# Patient Record
Sex: Female | Born: 1990 | Race: Black or African American | Hispanic: No | Marital: Single | Smoking: Never smoker
Health system: Southern US, Community
[De-identification: ages and names within clinical notes are randomized; demographics above are authoritative.]

---

## 2009-05-30 ENCOUNTER — Emergency Department (HOSPITAL_COMMUNITY): Admission: EM | Admit: 2009-05-30 | Discharge: 2009-05-30 | Payer: Self-pay | Admitting: Emergency Medicine

## 2014-06-22 ENCOUNTER — Emergency Department: Payer: Self-pay | Admitting: Emergency Medicine

## 2014-09-02 ENCOUNTER — Emergency Department
Admission: EM | Admit: 2014-09-02 | Discharge: 2014-09-02 | Disposition: A | Discharge status: Home / Self Care | Payer: No Typology Code available for payment source | Attending: Emergency Medicine | Admitting: Emergency Medicine

## 2014-09-02 ENCOUNTER — Encounter: Payer: Self-pay | Admitting: *Deleted

## 2014-09-02 DIAGNOSIS — S46911A Strain of unspecified muscle, fascia and tendon at shoulder and upper arm level, right arm, initial encounter: Secondary | ICD-10-CM | POA: Diagnosis not present

## 2014-09-02 DIAGNOSIS — S4991XA Unspecified injury of right shoulder and upper arm, initial encounter: Secondary | ICD-10-CM | POA: Diagnosis present

## 2014-09-02 DIAGNOSIS — S66911A Strain of unspecified muscle, fascia and tendon at wrist and hand level, right hand, initial encounter: Secondary | ICD-10-CM | POA: Diagnosis not present

## 2014-09-02 DIAGNOSIS — Y9389 Activity, other specified: Secondary | ICD-10-CM | POA: Diagnosis not present

## 2014-09-02 DIAGNOSIS — Y998 Other external cause status: Secondary | ICD-10-CM | POA: Diagnosis not present

## 2014-09-02 DIAGNOSIS — Y92410 Unspecified street and highway as the place of occurrence of the external cause: Secondary | ICD-10-CM | POA: Insufficient documentation

## 2014-09-02 MED ORDER — CYCLOBENZAPRINE HCL 10 MG PO TABS: 10.0000 mg | ORAL_TABLET | Freq: Three times a day (TID) | ORAL | Status: AC | PRN

## 2014-09-02 MED ORDER — NAPROXEN 500 MG PO TABS: 500.0000 mg | ORAL_TABLET | Freq: Two times a day (BID) | ORAL | Status: AC
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
## 2017-07-27 ENCOUNTER — Encounter: Payer: Self-pay | Admitting: Emergency Medicine

## 2017-07-27 ENCOUNTER — Other Ambulatory Visit: Payer: Self-pay

## 2017-07-27 ENCOUNTER — Emergency Department
Admission: EM | Admit: 2017-07-27 | Discharge: 2017-07-27 | Disposition: A | Discharge status: Home / Self Care | Payer: 59 | Attending: Emergency Medicine | Admitting: Emergency Medicine

## 2017-07-27 DIAGNOSIS — T7621XA Adult sexual abuse, suspected, initial encounter: Secondary | ICD-10-CM | POA: Diagnosis present

## 2017-07-27 DIAGNOSIS — T7421XA Adult sexual abuse, confirmed, initial encounter: Secondary | ICD-10-CM | POA: Diagnosis not present

## 2017-07-27 LAB — URINALYSIS, COMPLETE (UACMP) WITH MICROSCOPIC
Bacteria, UA: NONE SEEN
Bilirubin Urine: NEGATIVE
GLUCOSE, UA: NEGATIVE mg/dL
HGB URINE DIPSTICK: NEGATIVE
Ketones, ur: NEGATIVE mg/dL
NITRITE: POSITIVE — AB
PH: 7 (ref 5.0–8.0)
PROTEIN: 30 mg/dL — AB
SPECIFIC GRAVITY, URINE: 1.021 (ref 1.005–1.030)

## 2017-07-27 LAB — PREGNANCY, URINE: Preg Test, Ur: NEGATIVE

## 2017-07-27 LAB — RAPID HIV SCREEN (HIV 1/2 AB+AG)
HIV 1/2 Antibodies: NONREACTIVE
HIV-1 P24 Antigen - HIV24: NONREACTIVE

## 2017-07-27 MED ORDER — ULIPRISTAL ACETATE 30 MG PO TABS
30.0000 mg | ORAL_TABLET | Freq: Once | ORAL | Status: AC
Start: 2017-07-27 — End: 2017-07-27
  Administered 2017-07-27: 30 mg via ORAL
  Filled 2017-07-27: qty 1

## 2017-07-27 MED ORDER — LIDOCAINE HCL (PF) 1 % IJ SOLN
0.9000 mL | Freq: Once | INTRAMUSCULAR | Status: AC
  Administered 2017-07-27: 0.9 mL
  Filled 2017-07-27: qty 5

## 2017-07-27 MED ORDER — AZITHROMYCIN 500 MG PO TABS
1000.0000 mg | ORAL_TABLET | Freq: Once | ORAL | Status: AC
  Administered 2017-07-27: 1000 mg via ORAL
  Filled 2017-07-27: qty 2

## 2017-07-27 MED ORDER — METRONIDAZOLE 500 MG PO TABS
2000.0000 mg | ORAL_TABLET | Freq: Once | ORAL | Status: AC
  Administered 2017-07-27: 2000 mg via ORAL
  Filled 2017-07-27: qty 4

## 2017-07-27 MED ORDER — CEFTRIAXONE SODIUM 250 MG IJ SOLR
250.0000 mg | Freq: Once | INTRAMUSCULAR | Status: AC
  Administered 2017-07-27: 250 mg via INTRAMUSCULAR
  Filled 2017-07-27: qty 250
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
## 2017-07-27 NOTE — ED Notes (Signed)
Blood drawn and pt instructed to blot after urination and to keep toilet paper, per SANE nurse instructions to this RN.
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
## 2017-07-27 NOTE — SANE Note (Signed)
HIV rapid test = negative for antibodies.    Pt declined HIV prophylaxis.  Flagyl given to pt to take at home on Wednesday with food.  Verbalizes an understanding of discharge instructions.

## 2017-07-27 NOTE — ED Notes (Signed)
SANE nurse arrived at this time.

## 2017-11-01 ENCOUNTER — Ambulatory Visit (INDEPENDENT_AMBULATORY_CARE_PROVIDER_SITE_OTHER): Payer: 59

## 2017-11-01 ENCOUNTER — Ambulatory Visit
Admission: EM | Admit: 2017-11-01 | Discharge: 2017-11-01 | Disposition: A | Discharge status: Home / Self Care | Payer: 59 | Attending: Family Medicine | Admitting: Family Medicine

## 2017-11-01 DIAGNOSIS — M25561 Pain in right knee: Secondary | ICD-10-CM

## 2017-11-01 MED ORDER — MELOXICAM 15 MG PO TABS: 15.0000 mg | ORAL_TABLET | Freq: Every day | ORAL | 0 refills | Status: DC | PRN

## 2017-11-01 NOTE — ED Triage Notes (Signed)
As per patient has Right knee pain onset months but past few weeks getting worst started mid December 2018 in [employer], any running, jumping activity pops her knee.

## 2017-11-01 NOTE — ED Provider Notes (Signed)
[facility] ____________________________________________  Time seen: Approximately 2:09 PM  I have reviewed the triage vital signs and the nursing notes.   HISTORY  Chief Complaint Knee Pain   HPI Vanessa Douglas is a 27 y.o. female presenting for evaluation of right knee pain.  Patient states that she has been having intermittent right pain for the last 7 months, increase of the last few weeks.  States that pain started when she started exercising again stating pain is present with most activities, but particularly worse with squatting and running.  Has continue to remain active.  Denies pain radiation or paresthesias.  States that pain is accompanied with a popping sensation as well as occasional feeling of giving out.  Denies direct trauma or direct injury.  Denies fall.  No rash, skin changes or noticeable swelling.  Reports otherwise feels well.  States when the pain first started in December she was having significant pain, but reports now is more of a milder pain.  Occasionally takes ibuprofen.  Does not take any medications on a regular basis.  Denies other aggravating or alleviating factors.  Reports otherwise feels well.Denies recent sickness. Denies recent antibiotic use.   No LMP recorded. (Menstrual status: IUD).Denies pregnancy.   History reviewed. No pertinent past medical history.  There are no active problems to display for this patient.   History reviewed. No pertinent surgical history.   No current facility-administered medications for this encounter.   Current Outpatient Medications:  .  meloxicam (MOBIC) 15 MG tablet, Take 1 tablet (15 mg total) by mouth daily as needed., Disp: 10 tablet, Rfl: 0  Allergies Septra [sulfamethoxazole-trimethoprim]  No family history on file.  Social History Social History   Tobacco Use  . Smoking status: Never Smoker  . Smokeless tobacco: Never Used  Substance Use Topics  . Alcohol use: Yes  . Drug use: No      Review of Systems Constitutional: No fever/chills Cardiovascular: Denies chest pain. Respiratory: Denies shortness of breath. Gastrointestinal: No abdominal pain.   Musculoskeletal: Negative for back pain. As above.  Skin: Negative for rash.  ____________________________________________   PHYSICAL EXAM:  VITAL SIGNS: ED Triage Vitals  Enc Vitals Group     BP 11/01/17 1142 108/79     Pulse Rate 11/01/17 1142 66     Resp 11/01/17 1142 16     Temp 11/01/17 1142 99.1 F (37.3 C)     Temp Source 11/01/17 1142 Oral     SpO2 11/01/17 1142 99 %     Weight 11/01/17 1140 203 lb (92.1 kg)     Height 11/01/17 1140 5' 9.5" (1.765 m)     Head Circumference --      Peak Flow --      Pain Score 11/01/17 1139 7     Pain Loc --      Pain Edu? --      Excl. in GC? --     Constitutional: Alert and oriented. Well appearing and in no acute distress. ENT      Head: Normocephalic and atraumatic. Cardiovascular: Normal rate, regular rhythm. Grossly normal heart sounds.  Good peripheral circulation. Respiratory: Normal respiratory effort without tachypnea nor retractions. Breath sounds are clear and equal bilaterally. No wheezes, rales, rhonchi. Musculoskeletal:  Steady gait. Bilateral pedal pulses equal and easily palpated. Except: Right anterior to lateral knee mild tenderness to direct palpation surrounding patella and more laterally, mild lateral joint effusion noted, mild pain with resisted knee extension, no pain with flexion, mild  pain with lateral stress, no pain with medial stress, mild pain with anterior drawer test, negative posterior drawer test, full range of motion present.  Right lower chimney otherwise nontender. Neurologic:  Normal speech and language. No gross focal neurologic deficits are appreciated. Speech is normal. No gait instability.  Skin:  Skin is warm, dry and intact. No rash noted. Psychiatric: Mood and affect are normal. Speech and behavior are normal. Patient  exhibits appropriate insight and judgment   ___________________________________________   LABS (all labs ordered are listed, but only abnormal results are displayed)  Labs Reviewed - No data to display  RADIOLOGY  Dg Knee Complete 4 Views Right  Result Date: 11/01/2017 CLINICAL DATA:  Anterolateral right knee pain for 6 months. No reported injury. EXAM: RIGHT KNEE - COMPLETE 4+ VIEW COMPARISON:  None. FINDINGS: No fracture. No dislocation. Small suprapatellar right knee joint effusion. Minimal tricompartmental right knee osteoarthritis with tiny marginal osteophytes in all compartments. Small right tibial tubercle enthesophyte. No suspicious focal osseous lesions. No radiopaque foreign body. IMPRESSION: Small suprapatellar right knee joint effusion. Minimal tricompartmental right knee osteoarthritis. Small right tibial tubercle enthesophyte. Electronically Signed   By: Delbert Phenix M.D.   On: 11/01/2017 12:54   ____________________________________________   PROCEDURES Procedures   INITIAL IMPRESSION / ASSESSMENT AND PLAN / ED COURSE  Pertinent labs & imaging results that were available during my care of the patient were reviewed by me and considered in my medical decision making (see chart for details).  Well-appearing patient.  No acute distress.  Right knee x-ray as above per radiologist, small suprapatellar right knee joint effusion with right knee osteoarthritis.  Discussed this in detail with patient.  Also discussed concern and suspect of meniscal injury.  Recommend for further follow-up with orthopedic as may need MRI.  Will treat with oral daily Mobic and recommended knee sleeve with patellar opening, and avoidance of aggravating activities. Discussed indication, risks and benefits of medications with patient.  Discussed follow up with Primary care physician this week. Discussed follow up and return parameters including no resolution or any worsening concerns. Patient verbalized  understanding and agreed to plan.   ____________________________________________   FINAL CLINICAL IMPRESSION(S) / ED DIAGNOSES  Final diagnoses:  Acute pain of right knee     ED Discharge Orders        Ordered    meloxicam (MOBIC) 15 MG tablet  Daily PRN     11/01/17 1258       Note: This dictation was prepared with Dragon dictation along with smaller phrase technology. Any transcriptional errors that result from this process are unintentional.         Renford Dills, NP 11/01/17 1414

## 2017-11-01 NOTE — Discharge Instructions (Addendum)
Take medication as prescribed. Rest. Avoid aggravating activities. Wear knee brace as discussed.   Follow-up with orthopedic in 1 week as discussed.  See above.  Follow up with your primary care physician this week as needed. Return to Urgent care for new or worsening concerns.

## 2018-04-13 ENCOUNTER — Other Ambulatory Visit: Payer: Self-pay

## 2018-04-13 ENCOUNTER — Emergency Department
Admission: EM | Admit: 2018-04-13 | Discharge: 2018-04-14 | Disposition: A | Discharge status: Home / Self Care | Payer: 59 | Attending: Emergency Medicine | Admitting: Emergency Medicine

## 2018-04-13 ENCOUNTER — Emergency Department: Payer: 59

## 2018-04-13 ENCOUNTER — Encounter: Payer: Self-pay | Admitting: Emergency Medicine

## 2018-04-13 DIAGNOSIS — F419 Anxiety disorder, unspecified: Secondary | ICD-10-CM | POA: Diagnosis not present

## 2018-04-13 DIAGNOSIS — R0602 Shortness of breath: Secondary | ICD-10-CM | POA: Insufficient documentation

## 2018-04-13 DIAGNOSIS — R251 Tremor, unspecified: Secondary | ICD-10-CM | POA: Diagnosis present

## 2018-04-13 NOTE — ED Triage Notes (Signed)
Pt in via [facility] from home; reports sudden onset shortness of breath and tremors while at home watching tv, denies any chest pain.  Pt vitals WDL, symptoms have resolved at this time.  Pt denies any hx of anxiety or panic attacks.  NAD noted at this time.

## 2018-04-14 LAB — CBC
HEMATOCRIT: 39.4 % (ref 36.0–46.0)
HEMOGLOBIN: 12.9 g/dL (ref 12.0–15.0)
MCH: 28 pg (ref 26.0–34.0)
MCHC: 32.7 g/dL (ref 30.0–36.0)
MCV: 85.7 fL (ref 80.0–100.0)
Platelets: 315 10*3/uL (ref 150–400)
RBC: 4.6 MIL/uL (ref 3.87–5.11)
RDW: 12.8 % (ref 11.5–15.5)
WBC: 9.8 10*3/uL (ref 4.0–10.5)
nRBC: 0 % (ref 0.0–0.2)

## 2018-04-14 LAB — BASIC METABOLIC PANEL
ANION GAP: 7 (ref 5–15)
BUN: 18 mg/dL (ref 6–20)
CO2: 26 mmol/L (ref 22–32)
Calcium: 9.4 mg/dL (ref 8.9–10.3)
Chloride: 104 mmol/L (ref 98–111)
Creatinine, Ser: 0.96 mg/dL (ref 0.44–1.00)
GFR calc Af Amer: >60 mL/min (ref >60)
GFR calc non Af Amer: >60 mL/min (ref >60)
GLUCOSE: 102 mg/dL — AB (ref 70–99)
Potassium: 3.8 mmol/L (ref 3.5–5.1)
Sodium: 137 mmol/L (ref 135–145)

## 2018-04-14 LAB — FIBRIN DERIVATIVES D-DIMER (ARMC ONLY): Fibrin derivatives D-dimer (ARMC): 230.54 ng/mL (FEU) (ref 0.00–499.00)

## 2018-04-14 LAB — TSH: TSH: 1.622 u[IU]/mL (ref 0.350–4.500)

## 2018-04-14 LAB — TROPONIN I: Troponin I: <0.03 ng/mL (ref <0.03)

## 2018-04-14 NOTE — ED Provider Notes (Signed)
[facility] Provider Note __   First MD Initiated Contact with Patient 04/13/18 2356     (approximate)  I have reviewed the triage vital signs and the nursing notes.   HISTORY  Chief Complaint Panic Attack   HPI Vanessa Douglas is a 27 y.o. female with no chronic medical conditions presents emergency department via EMS from home with report of sudden onset of bilateral arm tremulousness while watching television tonight.  Patient states that subsequently upon standing she experience shortness of breath and chest tightness.  Patient states that symptoms resolved without any intervention after 30 minutes.  Patient does not admit to any preceding feelings of anxiety.  Patient denies any known history of CAD DVT or PE.   Patient denies any lower extremity pain or swelling.  Patient denies any nausea vomiting diaphoresis or dizziness.   Past medical history None There are no active problems to display for this patient.   Past surgical history None  Prior to Admission medications   Medication Sig Start Date End Date Taking? Authorizing Provider  meloxicam (MOBIC) 15 MG tablet Take 1 tablet (15 mg total) by mouth daily as needed. 11/01/17   Renford Dills, NP    Allergies Septra [sulfamethoxazole-trimethoprim]  No family history on file.  Social History Social History   Tobacco Use  . Smoking status: Never Smoker  . Smokeless tobacco: Never Used  Substance Use Topics  . Alcohol use: Yes  . Drug use: No    Review of Systems Constitutional: No fever/chills Eyes: No visual changes. ENT: No sore throat. Cardiovascular: Denies chest pain. Respiratory: Positive for shortness of breath (resolved) Gastrointestinal: No abdominal pain.  No nausea, no vomiting.  No diarrhea.  No constipation. Genitourinary: Negative for dysuria. Musculoskeletal: Negative for neck pain.  Negative for back pain. Integumentary: Negative for  rash. Neurological: Negative for headaches, focal weakness or numbness.   ____________________________________________   PHYSICAL EXAM:  VITAL SIGNS: ED Triage Vitals  Enc Vitals Group     BP 04/13/18 2225 131/79     Pulse Rate 04/13/18 2225 86     Resp 04/13/18 2225 16     Temp 04/13/18 2225 98.4 F (36.9 C)     Temp Source 04/13/18 2225 Oral     SpO2 04/13/18 2225 96 %     Weight 04/13/18 2226 96.2 kg (212 lb)     Height 04/13/18 2226 1.753 m (5\' 9" )     Head Circumference --      Peak Flow --      Pain Score 04/13/18 2226 0     Pain Loc --      Pain Edu? --      Excl. in GC? --     Constitutional: Alert and oriented. Well appearing and in no acute distress. Eyes: Conjunctivae are normal. PERRL. EOMI. Mouth/Throat: Mucous membranes are moist.  Oropharynx non-erythematous. Neck: No stridor.   Cardiovascular: Normal rate, regular rhythm. Good peripheral circulation. Grossly normal heart sounds. Respiratory: Normal respiratory effort.  No retractions. Lungs CTAB. Gastrointestinal: Soft and nontender. No distention.  Musculoskeletal: No lower extremity tenderness nor edema. No gross deformities of extremities. Neurologic:  Normal speech and language. No gross focal neurologic deficits are appreciated.  Skin:  Skin is warm, dry and intact. No rash noted. Psychiatric: Mood and affect are normal. Speech and behavior are normal.  ____________________________________________   LABS (all labs ordered are listed, but only abnormal results are displayed)  Labs Reviewed  BASIC METABOLIC  PANEL - Abnormal; Notable for the following components:      Result Value   Glucose, Bld 102 (*)    All other components within normal limits  CBC  TROPONIN I  TSH  FIBRIN DERIVATIVES D-DIMER ([facility] ONLY)   ____________________________________________  EKG  ED ECG REPORT I, Grover N BROWN, the attending physician, personally viewed and interpreted this ECG.   Date: 04/13/2018  EKG  Time: 10:34 PM  Rate: 71  Rhythm: Normal sinus rhythm  Axis: Normal  Intervals: Normal  ST&T Change: None  ____________________________________________  RADIOLOGY I, [place] N BROWN, personally viewed and evaluated these images (plain radiographs) as part of my medical decision making, as well as reviewing the written report by the radiologist.  ED MD interpretation: Negative AP chest x-ray per radiologist  Official radiology report(s): Dg Chest Port 1 View  Result Date: 04/14/2018 CLINICAL DATA:  Sudden onset of shortness of breath. Dyspnea and chest pain. EXAM: PORTABLE CHEST 1 VIEW COMPARISON:  None. FINDINGS: The cardiomediastinal contours are normal. The lungs are clear. Pulmonary vasculature is normal. No consolidation, pleural effusion, or pneumothorax. No acute osseous abnormalities are seen. IMPRESSION: Negative AP view of the chest. Electronically Signed   By: Narda RutherfordMelanie  Sanford M.D.   On: 04/14/2018 00:37      Procedures   ____________________________________________   INITIAL IMPRESSION / ASSESSMENT AND PLAN / ED COURSE  As part of my medical decision making, I reviewed the following data within the electronic MEDICAL RECORD NUMBER    27 year old female presenting with above-stated history and physical exam secondary to acute onset of tremulousness and dyspnea chest tightness which resolved before arrival.  EKG revealed no evidence of ischemia or infarction.  Laboratory data including troponin negative, likewise chest x-ray negative suspect possible anxiety as the etiology for the patient's symptoms.  Patient states that she has a very important presentation that she has to do this morning.  Spoke with the patient at length regarding necessity of following up if symptoms were to recur. ____________________________________________  FINAL CLINICAL IMPRESSION(S) / ED DIAGNOSES  Final diagnoses:  Anxiety  Shortness of breath     MEDICATIONS GIVEN DURING THIS  VISIT:  Medications - No data to display   ED Discharge Orders    None       Note:  This document was prepared using Dragon voice recognition software and may include unintentional dictation errors.    Darci CurrentBrown,  N, MD 04/14/18 [phone]

## 2018-07-10 ENCOUNTER — Encounter: Payer: Self-pay | Admitting: Emergency Medicine

## 2018-07-10 ENCOUNTER — Ambulatory Visit
Admission: EM | Admit: 2018-07-10 | Discharge: 2018-07-10 | Disposition: A | Discharge status: Home / Self Care | Payer: 59 | Attending: Family Medicine | Admitting: Family Medicine

## 2018-07-10 ENCOUNTER — Other Ambulatory Visit: Payer: Self-pay

## 2018-07-10 DIAGNOSIS — W228XXA Striking against or struck by other objects, initial encounter: Secondary | ICD-10-CM

## 2018-07-10 DIAGNOSIS — S0591XA Unspecified injury of right eye and orbit, initial encounter: Secondary | ICD-10-CM

## 2018-07-10 MED ORDER — KETOROLAC TROMETHAMINE 0.5 % OP SOLN: 1.0000 [drp] | Freq: Four times a day (QID) | OPHTHALMIC | 0 refills | Status: AC

## 2018-07-10 MED ORDER — CIPROFLOXACIN HCL 0.3 % OP SOLN: 1.0000 [drp] | Freq: Four times a day (QID) | OPHTHALMIC | 0 refills | Status: AC
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
## 2018-07-10 NOTE — Discharge Instructions (Signed)
Call [facility] or go to the ER if this worsens or does not improve.  Eye drops as prescribed.  Dr. Adriana Simas
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
## 2018-12-21 ENCOUNTER — Other Ambulatory Visit: Payer: Self-pay

## 2018-12-21 ENCOUNTER — Ambulatory Visit
Admission: EM | Admit: 2018-12-21 | Discharge: 2018-12-21 | Disposition: A | Discharge status: Home / Self Care | Payer: 59 | Attending: Family Medicine | Admitting: Family Medicine

## 2018-12-21 DIAGNOSIS — R590 Localized enlarged lymph nodes: Secondary | ICD-10-CM | POA: Diagnosis not present

## 2018-12-21 MED ORDER — AMOXICILLIN 875 MG PO TABS: 875.0000 mg | ORAL_TABLET | Freq: Two times a day (BID) | ORAL | 0 refills | Status: DC

## 2018-12-21 NOTE — Discharge Instructions (Signed)
Recommend follow up with Primary Care Provider in 2 weeks for recheck and possible referral to specialist for further evaluation/testing

## 2018-12-21 NOTE — ED Notes (Signed)
Pt in treatment room.  In NAD.  Needs address.  Pt/Fam updated on POC.   Once exposed obvious, small, bump noticed to right side of pt's neck.   Hard feeling.  Skin around it is soft.    Pt has history of cyst removal when younger.

## 2018-12-21 NOTE — ED Triage Notes (Signed)
Reporting lump on side of neck.  Been there for couple of weeks.  From nowhere.  Hasn't gone away.  Noticeable, not painful yet.  Difficulties with breathing, talking, or swallowing.    Here for further assessment.

## 2019-01-11 NOTE — ED Provider Notes (Signed)
[facility]    CSN: [id number] Arrival date & time: 12/21/18  1521      History   Chief Complaint No chief complaint on file.   HPI Laurina Fiser is a 28 y.o. female.   28 yo female with a c/o a "lump on the right side of my neck". States it's been present for about 2 weeks. Denies any pain, redness, drainage, fevers, chills, sore throat, ear pain, rash, difficulty breathing or swallowing.      No past medical history on file.  There are no active problems to display for this patient.   No past surgical history on file.  OB History   No obstetric history on file.      Home Medications    Prior to Admission medications   Medication Sig Start Date End Date Taking? Authorizing Provider  escitalopram (LEXAPRO) 10 MG tablet Take 10 mg by mouth daily.   Yes [provider]  amoxicillin (AMOXIL) 875 MG tablet Take 1 tablet (875 mg total) by mouth 2 (two) times daily. 12/21/18   Norval Gable, MD  levonorgestrel (MIRENA) 20 MCG/24HR IUD 1 each by Intrauterine route once.    [provider]    Family History Family History  Problem Relation Age of Onset  . Healthy Mother   . Healthy Father     Social History Social History   Tobacco Use  . Smoking status: Never Smoker  . Smokeless tobacco: Never Used  Substance Use Topics  . Alcohol use: Not Currently  . Drug use: No     Allergies   Septra [sulfamethoxazole-trimethoprim]   Review of Systems Review of Systems   Physical Exam Triage Vital Signs ED Triage Vitals  Enc Vitals Group     BP 12/21/18 1541 126/83     Pulse Rate 12/21/18 1541 (!) 56     Resp 12/21/18 1541 16     Temp 12/21/18 1541 98.6 F (37 C)     Temp Source 12/21/18 1541 Oral     SpO2 12/21/18 1541 100 %     Weight --      Height --      Head Circumference --      Peak Flow --      Pain Score 12/21/18 1542 4     Pain Loc --      Pain Edu? --      Excl. in [place]? --    No data found.  Updated  Vital Signs BP 126/83   Pulse (!) 56   Temp 98.6 F (37 C) (Oral)   Resp 16   SpO2 100%   Visual Acuity Right Eye Distance:   Left Eye Distance:   Bilateral Distance:    Right Eye Near:   Left Eye Near:    Bilateral Near:     Physical Exam Vitals signs and nursing note reviewed.  Constitutional:      General: She is not in acute distress.    Appearance: She is not toxic-appearing or diaphoretic.  HENT:     Head: Normocephalic and atraumatic.     Right Ear: Tympanic membrane, ear canal and external ear normal.     Left Ear: Tympanic membrane, ear canal and external ear normal.     Nose: Nose normal.     Mouth/Throat:     Pharynx: No oropharyngeal exudate or posterior oropharyngeal erythema.     Tonsils: No tonsillar exudate or tonsillar abscesses.  Eyes:     General:  Right eye: No discharge.        Left eye: No discharge.  Cardiovascular:     Rate and Rhythm: Normal rate.  Lymphadenopathy:     Cervical: Cervical adenopathy present.     Right cervical: Superficial cervical adenopathy (single lymph node palpated; mobile, non-tender) present.  Skin:    Findings: No rash.  Neurological:     Mental Status: She is alert.      UC Treatments / Results  Labs (all labs ordered are listed, but only abnormal results are displayed) Labs Reviewed - No data to display  EKG   Radiology No results found.  Procedures Procedures (including critical care time)  Medications Ordered in UC Medications - No data to display  Initial Impression / Assessment and Plan / UC Course  I have reviewed the triage vital signs and the nursing notes.  Pertinent labs & imaging results that were available during my care of the patient were reviewed by me and considered in my medical decision making (see chart for details).      Final Clinical Impressions(s) / UC Diagnoses   Final diagnoses:  Lymphadenopathy of right cervical region     Discharge Instructions      Recommend follow up with Primary Care Provider in 2 weeks for recheck and possible referral to specialist for further evaluation/testing    ED Prescriptions    Medication Sig Dispense Auth. Provider   amoxicillin (AMOXIL) 875 MG tablet Take 1 tablet (875 mg total) by mouth 2 (two) times daily. 28 tablet Kalab Camps, Pamala Hurryrlando, MD     1. diagnosis reviewed with patient 2. rx as per orders above; reviewed possible side effects, interactions, risks and benefits  3. Recommended to patient to follow up with PCP and/or ENT if sign/symptoms does not resolve in the next 2 weeks as he may need further evaluation/work up    Controlled Substance Prescriptions [place] Controlled Substance Registry consulted? Not Applicable   Payton Mccallumonty, Christle Nolting, MD 01/11/19 0830

## 2019-01-18 IMAGING — CR DG KNEE COMPLETE 4+V*R*
4 series · 4 of 4 positions shown · non-contrast
Comparison: None.

CLINICAL DATA: Anterolateral right knee pain for 6 months. No
reported injury.

EXAM:
RIGHT KNEE - COMPLETE 4+ VIEW

[knee ap]
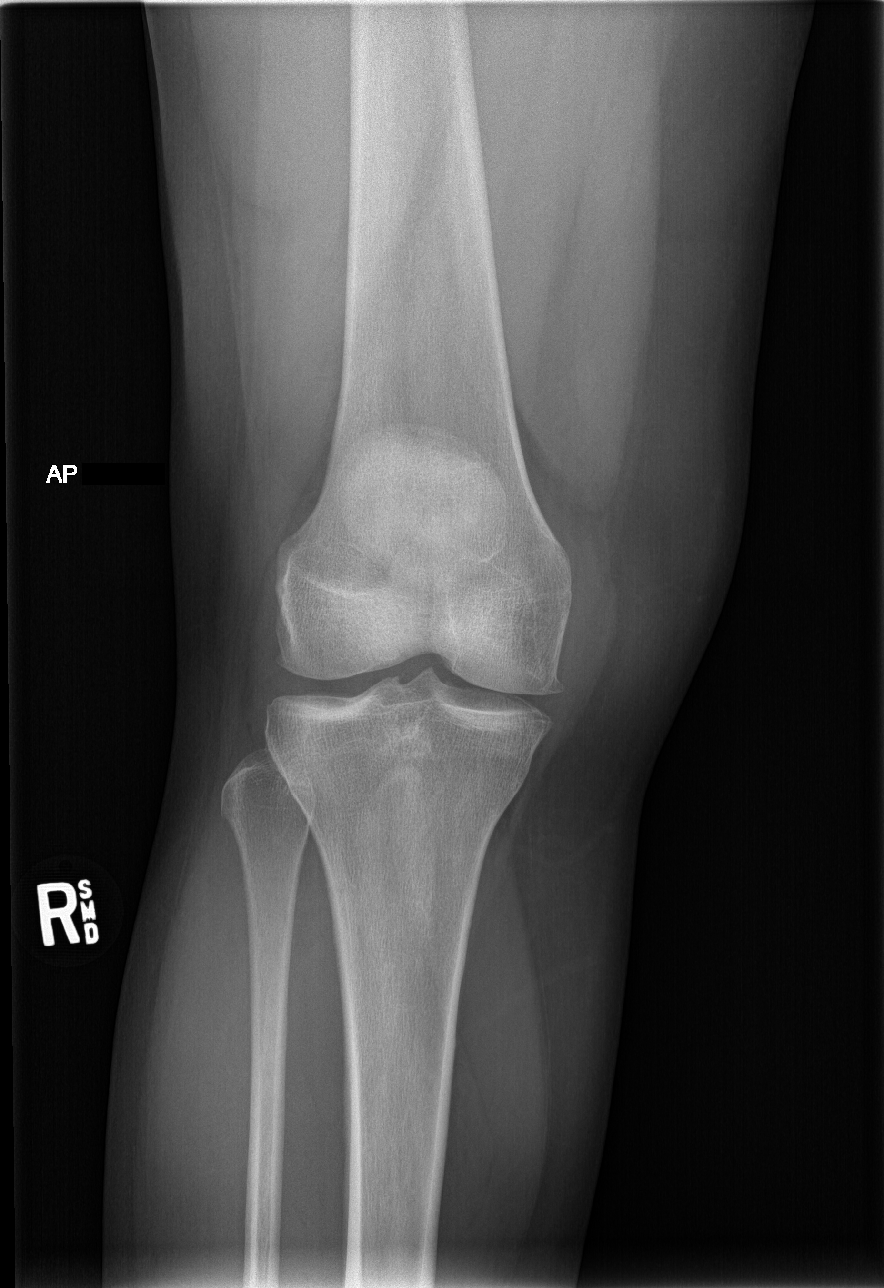

[knee lat]
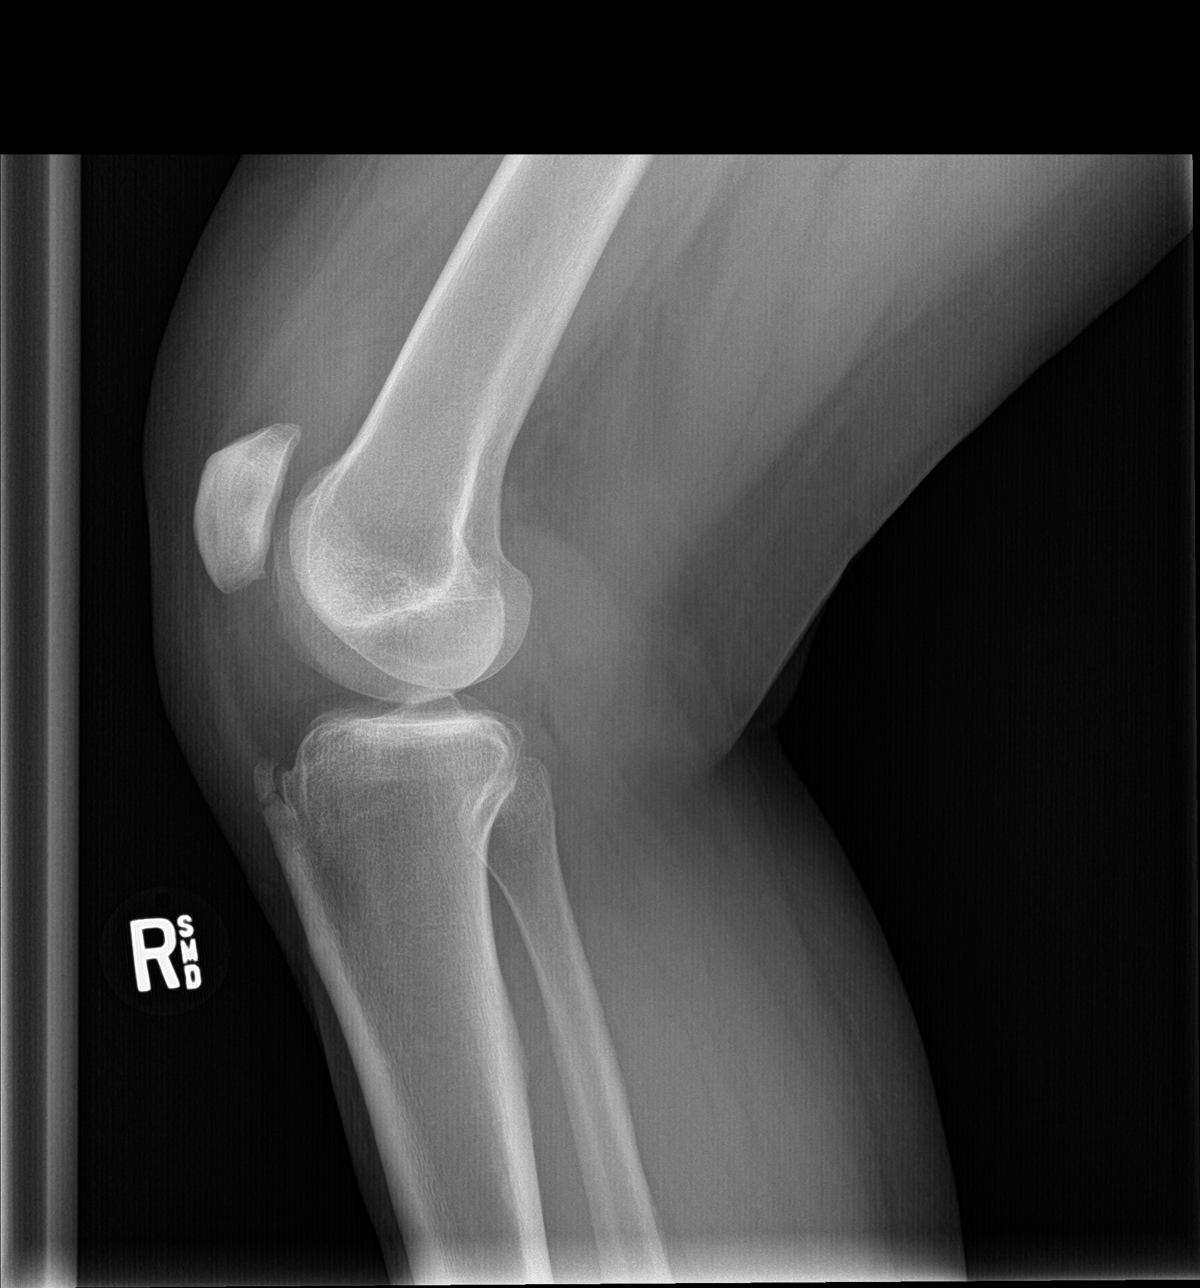

[tunnel]
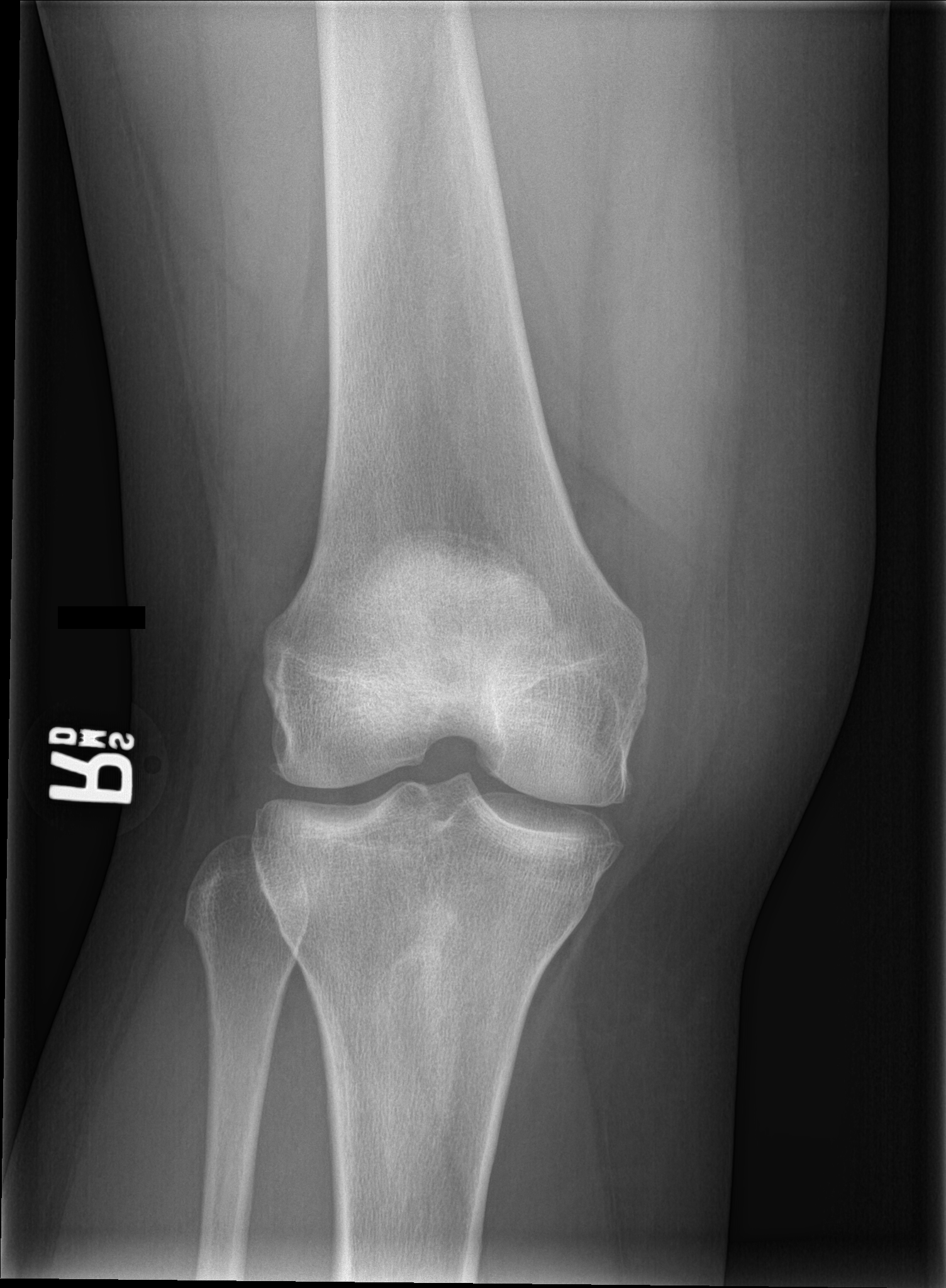

[patella skyline]
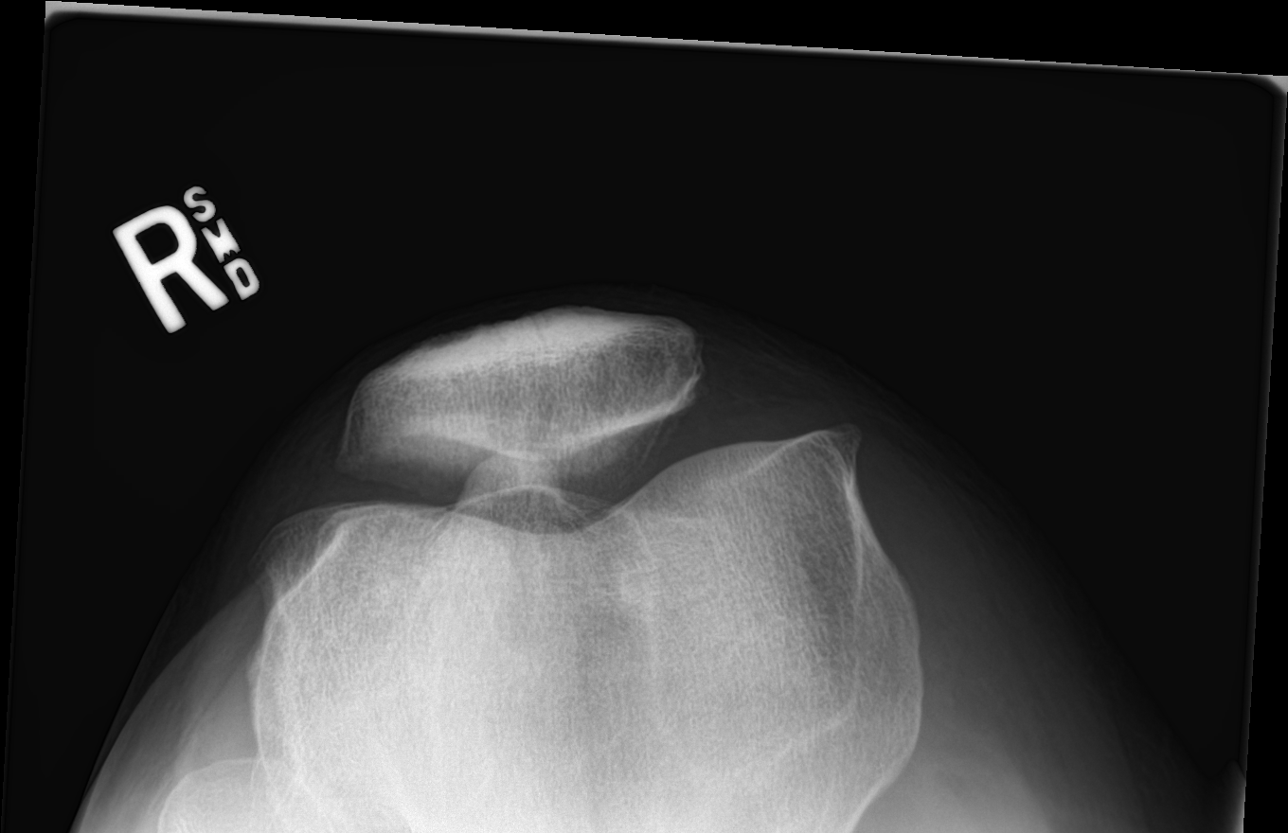

[4 of 4 positions shown; findings below may reference images not displayed]

FINDINGS: No fracture. No dislocation. Small suprapatellar right knee joint
effusion. Minimal tricompartmental right knee osteoarthritis with
tiny marginal osteophytes in all compartments. Small right tibial
tubercle enthesophyte. No suspicious focal osseous lesions. No
radiopaque foreign body.
IMPRESSION: Small suprapatellar right knee joint effusion. Minimal
tricompartmental right knee osteoarthritis. Small right tibial
tubercle enthesophyte.

## 2020-01-16 ENCOUNTER — Encounter: Payer: Self-pay | Admitting: Emergency Medicine

## 2020-01-16 ENCOUNTER — Other Ambulatory Visit: Payer: Self-pay

## 2020-01-16 ENCOUNTER — Ambulatory Visit
Admission: EM | Admit: 2020-01-16 | Discharge: 2020-01-16 | Disposition: A | Discharge status: Home / Self Care | Payer: BC Managed Care – PPO | Attending: Emergency Medicine | Admitting: Emergency Medicine

## 2020-01-16 DIAGNOSIS — F410 Panic disorder [episodic paroxysmal anxiety] without agoraphobia: Secondary | ICD-10-CM | POA: Diagnosis not present

## 2020-01-16 MED ORDER — HYDROXYZINE HCL 25 MG PO TABS: 25.0000 mg | ORAL_TABLET | Freq: Every evening | ORAL | 0 refills | Status: AC | PRN

## 2020-01-16 NOTE — Discharge Instructions (Addendum)
You were seen for panic attacks and are being treated for the same.   -Start taking Lexapro soon as possible. -Find a new therapist in your area.  Psychologytoday.com is a great resource to help in your therapy search. -Use over-the-counter melatonin to help with sleep. -Benadryl can also be used to help as a sleep aid.  Do not take Benadryl and the hydroxyzine (Atarax) at the same time. -If you are unable to decrease your panic attack symptoms over the course of a couple hours, seek further care.  Take care, Dr. Sharlet Salina, NP-c

## 2020-01-16 NOTE — ED Provider Notes (Signed)
[facility] - [place], [place]   Name: Vanessa Douglas DOB: 10-01-1990 MRN: [id number] CSN: [id number] PCP: [facility]  Arrival date and time:  01/16/20 4580  Chief Complaint:  Panic Attack   NOTE: Prior to seeing the patient today, I have reviewed the triage nursing documentation and vital signs. Clinical staff has updated patient's PMH/PSHx, current medication list, and drug allergies/intolerances to ensure comprehensive history available to assist in medical decision making.   History:   HPI: Vanessa Douglas is a 29 y.o. female who presents today with complaints of panic attacks.  Patient has a history of anxiety and was previously prescribed Lexapro.  She started feeling well a few months ago, and stopped the Lexapro on her own.  However, over the past couple of weeks she has noticed an increase amount of stress in her life.  Stress factors include a recent move, a death in the family, and increased stressors at work.  Approximately 1 week ago, she started having panic attacks which are interfering with her life.  She is having these panic attacks nightly.  Symptoms include headache, chest pain, shaking, and a feeling of impending doom.  She has since received a new prescription for Lexapro, but she has not yet started it.  Patient denies any suicidal/homicidal ideations.   History reviewed. No pertinent past medical history.  History reviewed. No pertinent surgical history.  Family History  Problem Relation Age of Onset   Healthy Mother    Healthy Father     Social History   Tobacco Use   Smoking status: Never Smoker   Smokeless tobacco: Never Used  Building services engineer Use: Never used  Substance Use Topics   Alcohol use: Not Currently   Drug use: No    There are no problems to display for this patient.   Home Medications:    Current Meds  Medication Sig   levonorgestrel (MIRENA) 20 MCG/24HR IUD 1 each by Intrauterine route once.     Allergies:   Septra [sulfamethoxazole-trimethoprim]  Review of Systems (ROS): Review of Systems  Respiratory: Positive for shortness of breath. Negative for chest tightness.   Cardiovascular: Negative for chest pain.  Psychiatric/Behavioral: Positive for sleep disturbance. Negative for hallucinations and self-injury. The patient is nervous/anxious.   All other systems reviewed and are negative.    Vital Signs: Today's Vitals   01/16/20 0846 01/16/20 0848  BP:  122/79  Pulse:  66  Resp:  14  Temp:  98.8 F (37.1 C)  TempSrc:  Oral  SpO2:  99%  Weight: 224 lb (101.6 kg)   Height: 5\' 10"  (1.778 m)   PainSc: 0-No pain     Physical Exam: Physical Exam Vitals and nursing note reviewed.  Constitutional:      Appearance: Normal appearance.  Cardiovascular:     Rate and Rhythm: Normal rate and regular rhythm.     Pulses: Normal pulses.     Heart sounds: Normal heart sounds.  Pulmonary:     Breath sounds: Normal breath sounds.  Skin:    General: Skin is warm and dry.  Neurological:     Mental Status: She is alert.  Psychiatric:        Mood and Affect: Mood normal.        Behavior: Behavior normal.        Thought Content: Thought content normal.      Urgent Care Treatments / Results:   LABS: PLEASE NOTE: all labs that were  ordered this encounter are listed, however only abnormal results are displayed. Labs Reviewed - No data to display  EKG: -None  RADIOLOGY: No results found.  PROCEDURES: Procedures  MEDICATIONS RECEIVED THIS VISIT: Medications - No data to display  PERTINENT CLINICAL COURSE NOTES/UPDATES:   Initial Impression / Assessment and Plan / Urgent Care Course:  Pertinent labs & imaging results that were available during my care of the patient were personally reviewed by me and considered in my medical decision making (see lab/imaging section of note for values and interpretations).  Vanessa Douglas is a 29 y.o. female who presents to [facility] today with complaints of panic attacks, diagnosed with the same, and treated as such with the medications and directions below. NP and patient reviewed discharge instructions below during visit.   Patient is well appearing overall in clinic today. She does not appear to be in any acute distress. Presenting symptoms (see HPI) and exam as documented above.   I have reviewed the follow up and strict return precautions for any new or worsening symptoms. Patient is aware of symptoms that would be deemed urgent/emergent, and would thus require further evaluation either here or in the emergency department. At the time of discharge, she verbalized understanding and consent with the discharge plan as it was reviewed with her. All questions were fielded by provider and/or clinic staff prior to patient discharge.    Final Clinical Impressions / Urgent Care Diagnoses:   Final diagnoses:  Panic disorder    New Prescriptions:  [place] Controlled Substance Registry consulted? Not Applicable  Meds ordered this encounter  Medications   hydrOXYzine (ATARAX/VISTARIL) 25 MG tablet    Sig: Take 1 tablet (25 mg total) by mouth at bedtime as needed. Tablet can be split if needed.    Dispense:  20 tablet    Refill:  0      Discharge Instructions     You were seen for panic attacks and are being treated for the same.   -Start taking Lexapro soon as possible. -Find a new therapist in your area.  Psychologytoday[facility]com is a great resource to help in your therapy search. -Use over-the-counter melatonin to help with sleep. -Benadryl can also be used to help as a sleep aid.  Do not take Benadryl and the hydroxyzine (Atarax) at the same time. -If you are unable to decrease your panic attack symptoms over the course of a couple hours, seek further care.  Take care, Dr. Sharlet Salina, NP-c     Recommended Follow up Care:  Patient encouraged to follow up with the following provider within the specified time  frame, or sooner as dictated by the severity of her symptoms. As always, she was instructed that for any urgent/emergent care needs, she should seek care either here or in the emergency department for more immediate evaluation.   Bailey Mech, DNP, NP-c    Bailey Mech, NP 01/16/20 [phone]

## 2020-01-16 NOTE — ED Triage Notes (Signed)
Patient reports having nightly panic attacks for a week.  Patient reports being diagnosed with anxiety by her PCP and was prescribed Lexapro but has not started taking it.

## 2023-05-07 ENCOUNTER — Other Ambulatory Visit: Payer: Self-pay | Admitting: Family Medicine

## 2023-05-07 DIAGNOSIS — E041 Nontoxic single thyroid nodule: Secondary | ICD-10-CM
